# Patient Record
Sex: Male | Born: 1990 | Hispanic: Yes | Marital: Married | Smoking: Current every day smoker
Health system: Southern US, Community
[De-identification: ages and names within clinical notes are randomized; demographics above are authoritative.]

## PROBLEM LIST (undated history)

## (undated) HISTORY — PX: APPENDECTOMY: SHX54

---

## 2015-07-14 ENCOUNTER — Emergency Department (HOSPITAL_COMMUNITY): Payer: BLUE CROSS/BLUE SHIELD | Admitting: Certified Registered Nurse Anesthetist

## 2015-07-14 ENCOUNTER — Observation Stay (HOSPITAL_COMMUNITY)
Admission: EM | Admit: 2015-07-14 | Discharge: 2015-07-15 | Disposition: A | Discharge status: Home / Self Care | Payer: BLUE CROSS/BLUE SHIELD | Attending: Surgery | Admitting: Surgery

## 2015-07-14 ENCOUNTER — Encounter (HOSPITAL_COMMUNITY)
Admission: EM | Disposition: A | Discharge status: Home / Self Care | Payer: Self-pay | Source: Home / Self Care | Attending: Emergency Medicine

## 2015-07-14 ENCOUNTER — Encounter (HOSPITAL_COMMUNITY): Payer: Self-pay | Admitting: Emergency Medicine

## 2015-07-14 ENCOUNTER — Emergency Department (HOSPITAL_COMMUNITY): Payer: BLUE CROSS/BLUE SHIELD

## 2015-07-14 DIAGNOSIS — K3580 Unspecified acute appendicitis: Principal | ICD-10-CM | POA: Diagnosis present

## 2015-07-14 DIAGNOSIS — K3589 Other acute appendicitis without perforation or gangrene: Secondary | ICD-10-CM

## 2015-07-14 DIAGNOSIS — F17200 Nicotine dependence, unspecified, uncomplicated: Secondary | ICD-10-CM | POA: Insufficient documentation

## 2015-07-14 DIAGNOSIS — R109 Unspecified abdominal pain: Secondary | ICD-10-CM | POA: Diagnosis present

## 2015-07-14 HISTORY — PX: LAPAROSCOPIC APPENDECTOMY: SHX408

## 2015-07-14 LAB — COMPREHENSIVE METABOLIC PANEL
ALT: 23 U/L (ref 17–63)
ANION GAP: 13 (ref 5–15)
AST: 29 U/L (ref 15–41)
Albumin: 4.5 g/dL (ref 3.5–5.0)
Alkaline Phosphatase: 97 U/L (ref 38–126)
BUN: 16 mg/dL (ref 6–20)
CALCIUM: 10 mg/dL (ref 8.9–10.3)
CO2: 23 mmol/L (ref 22–32)
Chloride: 103 mmol/L (ref 101–111)
Creatinine, Ser: 0.96 mg/dL (ref 0.61–1.24)
GLUCOSE: 114 mg/dL — AB (ref 65–99)
POTASSIUM: 3.2 mmol/L — AB (ref 3.5–5.1)
Sodium: 139 mmol/L (ref 135–145)
TOTAL PROTEIN: 7.6 g/dL (ref 6.5–8.1)
Total Bilirubin: 0.4 mg/dL (ref 0.3–1.2)

## 2015-07-14 LAB — I-STAT CHEM 8, ED
BUN: 18 mg/dL (ref 6–20)
CALCIUM ION: 1.16 mmol/L (ref 1.12–1.23)
CREATININE: 0.8 mg/dL (ref 0.61–1.24)
Chloride: 102 mmol/L (ref 101–111)
Glucose, Bld: 105 mg/dL — ABNORMAL HIGH (ref 65–99)
HEMATOCRIT: 48 % (ref 39.0–52.0)
HEMOGLOBIN: 16.3 g/dL (ref 13.0–17.0)
Potassium: 3.1 mmol/L — ABNORMAL LOW (ref 3.5–5.1)
SODIUM: 141 mmol/L (ref 135–145)
TCO2: 23 mmol/L (ref 0–100)

## 2015-07-14 LAB — URINALYSIS, ROUTINE W REFLEX MICROSCOPIC
BILIRUBIN URINE: NEGATIVE
Glucose, UA: NEGATIVE mg/dL
HGB URINE DIPSTICK: NEGATIVE
KETONES UR: 15 mg/dL — AB
Leukocytes, UA: NEGATIVE
NITRITE: NEGATIVE
PH: 7.5 (ref 5.0–8.0)
Protein, ur: NEGATIVE mg/dL
SPECIFIC GRAVITY, URINE: 1.022 (ref 1.005–1.030)

## 2015-07-14 LAB — CBC
HCT: 42.5 % (ref 39.0–52.0)
Hemoglobin: 15 g/dL (ref 13.0–17.0)
MCH: 28.4 pg (ref 26.0–34.0)
MCHC: 35.3 g/dL (ref 30.0–36.0)
MCV: 80.3 fL (ref 78.0–100.0)
Platelets: 197 10*3/uL (ref 150–400)
RBC: 5.29 MIL/uL (ref 4.22–5.81)
RDW: 12.6 % (ref 11.5–15.5)
WBC: 17.1 10*3/uL — ABNORMAL HIGH (ref 4.0–10.5)

## 2015-07-14 LAB — LIPASE, BLOOD: LIPASE: 34 U/L (ref 11–51)

## 2015-07-14 SURGERY — APPENDECTOMY, LAPAROSCOPIC
Anesthesia: General | Site: Abdomen

## 2015-07-14 MED ORDER — ONDANSETRON 4 MG PO TBDP: 4.0000 mg | ORAL_TABLET | Freq: Four times a day (QID) | ORAL | Status: DC | PRN

## 2015-07-14 MED ORDER — SODIUM CHLORIDE 0.9 % IV SOLN
INTRAVENOUS | Status: DC
  Administered 2015-07-14: via INTRAVENOUS

## 2015-07-14 MED ORDER — SUGAMMADEX SODIUM 200 MG/2ML IV SOLN
INTRAVENOUS | Status: DC | PRN
  Administered 2015-07-14: 200 mg via INTRAVENOUS

## 2015-07-14 MED ORDER — LIDOCAINE HCL (CARDIAC) 20 MG/ML IV SOLN
INTRAVENOUS | Status: DC | PRN
  Administered 2015-07-14: 100 mg via INTRAVENOUS

## 2015-07-14 MED ORDER — ROCURONIUM BROMIDE 50 MG/5ML IV SOLN
INTRAVENOUS | Status: AC
  Filled 2015-07-14: qty 1

## 2015-07-14 MED ORDER — LACTATED RINGERS IV SOLN
INTRAVENOUS | Status: DC | PRN
  Administered 2015-07-14: 22:00:00 via INTRAVENOUS

## 2015-07-14 MED ORDER — DEXTROSE 5 % IV SOLN
2.0000 g | Freq: Once | INTRAVENOUS | Status: AC
  Administered 2015-07-14: 2 g via INTRAVENOUS
  Filled 2015-07-14: qty 2

## 2015-07-14 MED ORDER — ONDANSETRON HCL 4 MG/2ML IJ SOLN
4.0000 mg | Freq: Four times a day (QID) | INTRAMUSCULAR | Status: DC | PRN
Start: 2015-07-14 — End: 2015-07-15

## 2015-07-14 MED ORDER — ONDANSETRON HCL 4 MG/2ML IJ SOLN
4.0000 mg | Freq: Once | INTRAMUSCULAR | Status: AC
  Administered 2015-07-14: 4 mg via INTRAVENOUS
  Filled 2015-07-14: qty 2

## 2015-07-14 MED ORDER — OXYCODONE-ACETAMINOPHEN 5-325 MG PO TABS
1.0000 | ORAL_TABLET | ORAL | Status: DC | PRN
  Administered 2015-07-15: 2 via ORAL
  Filled 2015-07-14: qty 2

## 2015-07-14 MED ORDER — HYDROMORPHONE HCL 1 MG/ML IJ SOLN: 0.2500 mg | INTRAMUSCULAR | Status: DC | PRN

## 2015-07-14 MED ORDER — FENTANYL CITRATE (PF) 250 MCG/5ML IJ SOLN
INTRAMUSCULAR | Status: AC
Start: 2015-07-14 — End: 2015-07-14
  Filled 2015-07-14: qty 5

## 2015-07-14 MED ORDER — MIDAZOLAM HCL 2 MG/2ML IJ SOLN
INTRAMUSCULAR | Status: AC
  Filled 2015-07-14: qty 2

## 2015-07-14 MED ORDER — DIPHENHYDRAMINE HCL 25 MG PO CAPS: 25.0000 mg | ORAL_CAPSULE | Freq: Four times a day (QID) | ORAL | Status: DC | PRN

## 2015-07-14 MED ORDER — ROCURONIUM BROMIDE 100 MG/10ML IV SOLN
INTRAVENOUS | Status: DC | PRN
  Administered 2015-07-14: 30 mg via INTRAVENOUS

## 2015-07-14 MED ORDER — FENTANYL CITRATE (PF) 100 MCG/2ML IJ SOLN
INTRAMUSCULAR | Status: DC | PRN
Start: 2015-07-14 — End: 2015-07-14
  Administered 2015-07-14: 50 ug via INTRAVENOUS

## 2015-07-14 MED ORDER — MORPHINE SULFATE (PF) 2 MG/ML IV SOLN: 2.0000 mg | INTRAVENOUS | Status: DC | PRN

## 2015-07-14 MED ORDER — KETOROLAC TROMETHAMINE 30 MG/ML IJ SOLN
INTRAMUSCULAR | Status: AC
  Administered 2015-07-14: 30 mg via INTRAVENOUS
  Filled 2015-07-14: qty 1

## 2015-07-14 MED ORDER — IOHEXOL 300 MG/ML  SOLN
100.0000 mL | Freq: Once | INTRAMUSCULAR | Status: AC | PRN
  Administered 2015-07-14: 100 mL via INTRAVENOUS

## 2015-07-14 MED ORDER — KETOROLAC TROMETHAMINE 30 MG/ML IJ SOLN
30.0000 mg | Freq: Four times a day (QID) | INTRAMUSCULAR | Status: DC
  Administered 2015-07-14 – 2015-07-15 (×2): 30 mg via INTRAVENOUS
  Filled 2015-07-14: qty 1

## 2015-07-14 MED ORDER — PHENYLEPHRINE 40 MCG/ML (10ML) SYRINGE FOR IV PUSH (FOR BLOOD PRESSURE SUPPORT)
PREFILLED_SYRINGE | INTRAVENOUS | Status: AC
  Filled 2015-07-14: qty 10

## 2015-07-14 MED ORDER — ONDANSETRON HCL 4 MG/2ML IJ SOLN
INTRAMUSCULAR | Status: AC
  Filled 2015-07-14: qty 2

## 2015-07-14 MED ORDER — SUCCINYLCHOLINE CHLORIDE 20 MG/ML IJ SOLN
INTRAMUSCULAR | Status: DC | PRN
  Administered 2015-07-14: 100 mg via INTRAVENOUS

## 2015-07-14 MED ORDER — IOHEXOL 300 MG/ML  SOLN: 25.0000 mL | Freq: Once | INTRAMUSCULAR | Status: DC | PRN

## 2015-07-14 MED ORDER — ONDANSETRON HCL 4 MG/2ML IJ SOLN
INTRAMUSCULAR | Status: DC | PRN
  Administered 2015-07-14: 4 mg via INTRAVENOUS

## 2015-07-14 MED ORDER — BUPIVACAINE-EPINEPHRINE (PF) 0.25% -1:200000 IJ SOLN
INTRAMUSCULAR | Status: AC
  Filled 2015-07-14: qty 30

## 2015-07-14 MED ORDER — ONDANSETRON HCL 4 MG/2ML IJ SOLN: 4.0000 mg | Freq: Once | INTRAMUSCULAR | Status: DC | PRN

## 2015-07-14 MED ORDER — ENOXAPARIN SODIUM 30 MG/0.3ML ~~LOC~~ SOLN: 30.0000 mg | SUBCUTANEOUS | Status: DC

## 2015-07-14 MED ORDER — PROPOFOL 10 MG/ML IV BOLUS
INTRAVENOUS | Status: AC
  Filled 2015-07-14: qty 20

## 2015-07-14 MED ORDER — EPHEDRINE SULFATE 50 MG/ML IJ SOLN
INTRAMUSCULAR | Status: AC
  Filled 2015-07-14: qty 1

## 2015-07-14 MED ORDER — HYDROMORPHONE HCL 1 MG/ML IJ SOLN
1.0000 mg | Freq: Once | INTRAMUSCULAR | Status: AC
  Administered 2015-07-14: 1 mg via INTRAVENOUS
  Filled 2015-07-14: qty 1

## 2015-07-14 MED ORDER — 0.9 % SODIUM CHLORIDE (POUR BTL) OPTIME
TOPICAL | Status: DC | PRN
  Administered 2015-07-14: 1000 mL

## 2015-07-14 MED ORDER — BUPIVACAINE-EPINEPHRINE 0.25% -1:200000 IJ SOLN
INTRAMUSCULAR | Status: DC | PRN
  Administered 2015-07-14: 30 mL

## 2015-07-14 MED ORDER — DIPHENHYDRAMINE HCL 50 MG/ML IJ SOLN: 25.0000 mg | Freq: Four times a day (QID) | INTRAMUSCULAR | Status: DC | PRN

## 2015-07-14 MED ORDER — SUCCINYLCHOLINE CHLORIDE 20 MG/ML IJ SOLN
INTRAMUSCULAR | Status: AC
  Filled 2015-07-14: qty 1

## 2015-07-14 MED ORDER — LIDOCAINE HCL (CARDIAC) 20 MG/ML IV SOLN
INTRAVENOUS | Status: AC
  Filled 2015-07-14: qty 5

## 2015-07-14 MED ORDER — SODIUM CHLORIDE 0.9 % IR SOLN
Status: DC | PRN
  Administered 2015-07-14: 1000 mL

## 2015-07-14 MED ORDER — SODIUM CHLORIDE 0.9 % IV BOLUS (SEPSIS)
1000.0000 mL | Freq: Once | INTRAVENOUS | Status: AC
  Administered 2015-07-14: 1000 mL via INTRAVENOUS

## 2015-07-14 MED ORDER — SUGAMMADEX SODIUM 200 MG/2ML IV SOLN
INTRAVENOUS | Status: AC
  Filled 2015-07-14: qty 2

## 2015-07-14 MED ORDER — PROPOFOL 10 MG/ML IV BOLUS
INTRAVENOUS | Status: DC | PRN
  Administered 2015-07-14: 130 mg via INTRAVENOUS

## 2015-07-14 MED ORDER — MIDAZOLAM HCL 5 MG/5ML IJ SOLN
INTRAMUSCULAR | Status: DC | PRN
  Administered 2015-07-14: 2 mg via INTRAVENOUS

## 2015-07-14 MED ORDER — MEPERIDINE HCL 25 MG/ML IJ SOLN: 6.2500 mg | INTRAMUSCULAR | Status: DC | PRN

## 2015-07-14 MED ORDER — SODIUM CHLORIDE 0.9 % IJ SOLN
INTRAMUSCULAR | Status: AC
  Filled 2015-07-14: qty 10

## 2015-07-14 SURGICAL SUPPLY — 46 items
APPLIER CLIP ROT 10 11.4 M/L (STAPLE)
BENZOIN TINCTURE PRP APPL 2/3 (GAUZE/BANDAGES/DRESSINGS) ×3 IMPLANT
BLADE SURG ROTATE 9660 (MISCELLANEOUS) IMPLANT
CANISTER SUCTION 2500CC (MISCELLANEOUS) ×3 IMPLANT
CHLORAPREP W/TINT 26ML (MISCELLANEOUS) ×3 IMPLANT
CLIP APPLIE ROT 10 11.4 M/L (STAPLE) IMPLANT
CLOSURE WOUND 1/2 X4 (GAUZE/BANDAGES/DRESSINGS) ×1
COVER SURGICAL LIGHT HANDLE (MISCELLANEOUS) ×3 IMPLANT
CUTTER FLEX LINEAR 45M (STAPLE) ×3 IMPLANT
DRSG TEGADERM 2-3/8X2-3/4 SM (GAUZE/BANDAGES/DRESSINGS) ×3 IMPLANT
DRSG TEGADERM 4X4.75 (GAUZE/BANDAGES/DRESSINGS) ×3 IMPLANT
ELECT REM PT RETURN 9FT ADLT (ELECTROSURGICAL) ×3
ELECTRODE REM PT RTRN 9FT ADLT (ELECTROSURGICAL) ×1 IMPLANT
ENDOLOOP SUT PDS II  0 18 (SUTURE)
ENDOLOOP SUT PDS II 0 18 (SUTURE) IMPLANT
FILTER SMOKE EVAC LAPAROSHD (FILTER) ×3 IMPLANT
GAUZE SPONGE 2X2 8PLY STRL LF (GAUZE/BANDAGES/DRESSINGS) ×1 IMPLANT
GLOVE BIO SURGEON STRL SZ 6.5 (GLOVE) ×2 IMPLANT
GLOVE BIO SURGEON STRL SZ7 (GLOVE) ×3 IMPLANT
GLOVE BIO SURGEONS STRL SZ 6.5 (GLOVE) ×1
GLOVE BIOGEL PI IND STRL 6.5 (GLOVE) ×1 IMPLANT
GLOVE BIOGEL PI IND STRL 7.0 (GLOVE) ×1 IMPLANT
GLOVE BIOGEL PI IND STRL 7.5 (GLOVE) ×1 IMPLANT
GLOVE BIOGEL PI INDICATOR 6.5 (GLOVE) ×2
GLOVE BIOGEL PI INDICATOR 7.0 (GLOVE) ×2
GLOVE BIOGEL PI INDICATOR 7.5 (GLOVE) ×2
GOWN STRL REUS W/ TWL LRG LVL3 (GOWN DISPOSABLE) ×3 IMPLANT
GOWN STRL REUS W/TWL LRG LVL3 (GOWN DISPOSABLE) ×6
KIT BASIN OR (CUSTOM PROCEDURE TRAY) ×3 IMPLANT
KIT ROOM TURNOVER OR (KITS) ×3 IMPLANT
NS IRRIG 1000ML POUR BTL (IV SOLUTION) ×3 IMPLANT
PAD ARMBOARD 7.5X6 YLW CONV (MISCELLANEOUS) ×3 IMPLANT
POUCH SPECIMEN RETRIEVAL 10MM (ENDOMECHANICALS) ×3 IMPLANT
RELOAD STAPLE TA45 3.5 REG BLU (ENDOMECHANICALS) ×3 IMPLANT
SCALPEL HARMONIC ACE (MISCELLANEOUS) ×3 IMPLANT
SET IRRIG TUBING LAPAROSCOPIC (IRRIGATION / IRRIGATOR) ×3 IMPLANT
SPECIMEN JAR SMALL (MISCELLANEOUS) ×3 IMPLANT
SPONGE GAUZE 2X2 STER 10/PKG (GAUZE/BANDAGES/DRESSINGS) ×2
STRIP CLOSURE SKIN 1/2X4 (GAUZE/BANDAGES/DRESSINGS) ×2 IMPLANT
SUT MNCRL AB 4-0 PS2 18 (SUTURE) ×3 IMPLANT
TOWEL OR 17X24 6PK STRL BLUE (TOWEL DISPOSABLE) ×3 IMPLANT
TOWEL OR 17X26 10 PK STRL BLUE (TOWEL DISPOSABLE) ×3 IMPLANT
TRAY LAPAROSCOPIC MC (CUSTOM PROCEDURE TRAY) ×3 IMPLANT
TROCAR XCEL BLADELESS 5X75MML (TROCAR) ×6 IMPLANT
TROCAR XCEL BLUNT TIP 100MML (ENDOMECHANICALS) ×3 IMPLANT
TUBING INSUFFLATION (TUBING) ×3 IMPLANT

## 2015-07-14 NOTE — Op Note (Signed)
Appendectomy, Lap, Procedure Note  Indications: The patient presented with a history of right-sided abdominal pain. A CT scan revealed findings consistent with early acute appendicitis.  Pre-operative Diagnosis: Acute appendicitis without mention of peritonitis  Post-operative Diagnosis: Same  Surgeon: Maayan Jenning K.   Assistants: none  Anesthesia: General endotracheal anesthesia  ASA Class: 1E  Procedure Details  The patient was seen again in the Holding Room. The risks, benefits, complications, treatment options, and expected outcomes were discussed with the patient and/or family. The possibilities of reaction to medication, perforation of viscus, bleeding, recurrent infection, finding a normal appendix, the need for additional procedures, failure to diagnose a condition, and creating a complication requiring transfusion or operation were discussed. There was concurrence with the proposed plan and informed consent was obtained. The site of surgery was properly noted. The patient was taken to Operating Room, identified as Gerald Mccarthy and the procedure verified as Appendectomy. A Time Out was held and the above information confirmed.  The patient was placed in the supine position and general anesthesia was induced.  The abdomen was prepped and draped in a sterile fashion. A one centimeter supraumbilical incision was made.  Dissection was carried down to the fascia bluntly.  The fascia was incised vertically.  We entered the peritoneal cavity bluntly.  A pursestring suture was passed around the incision with a 0 Vicryl.  The Hasson cannula was introduced into the abdomen and the tails of the suture were used to hold the Hasson in place.   The pneumoperitoneum was then established maintaining a maximum pressure of 15 mmHg.  Additional 5 mm cannulas then placed in the left lower quadrant of the abdomen and the right upper quadrant under direct visualization. A careful evaluation of the entire  abdomen was carried out. The patient was placed in Trendelenburg and left lateral decubitus position.  The scope was moved to the right upper quadrant port site. The cecum was mobilized medially.  The appendix was inflamed, but there was no sign of perforation or abscess. The appendix was carefully dissected. The appendix was was skeletonized with the harmonic scalpel.   The appendix was divided at its base using an endo-GIA stapler. Minimal appendiceal stump was left in place. There was no evidence of bleeding, leakage, or complication after division of the appendix. Irrigation was also performed and irrigate suctioned from the abdomen as well.  The umbilical port site was closed with the purse string suture. There was no residual palpable fascial defect.  The trocar site skin wounds were closed with 4-0 Monocryl.  Instrument, sponge, and needle counts were correct at the conclusion of the case.   Findings: The appendix was found to be inflamed. There were not signs of necrosis.  There was not perforation. There was not abscess formation.  Estimated Blood Loss:  Minimal         Drains: none         Specimens: appendix         Complications:  None; patient tolerated the procedure well.         Disposition: PACU - hemodynamically stable.         Condition: stable  Gerald ArmsMatthew K. Corliss Skainssuei, MD, [facility]  General/ Trauma Surgery  07/14/2015 10:43 PM

## 2015-07-14 NOTE — ED Notes (Signed)
Pt states around 1400 today had a sudden onset of RLQ pain. Pt is bent over in sereve pain. Pt denies any n/v/d.

## 2015-07-14 NOTE — Anesthesia Procedure Notes (Signed)
Procedure Name: Intubation Date/Time: 07/14/2015 9:49 PM Performed by: Rogelia BogaMUELLER, Taunia Frasco P Pre-anesthesia Checklist: Patient identified, Emergency Drugs available, Suction available, Patient being monitored and Timeout performed Patient Re-evaluated:Patient Re-evaluated prior to inductionOxygen Delivery Method: Circle system utilized Preoxygenation: Pre-oxygenation with 100% oxygen Intubation Type: IV induction, Cricoid Pressure applied and Rapid sequence Laryngoscope Size: Mac and 4 Grade View: Grade I Tube size: 7.5 mm Number of attempts: 1 Airway Equipment and Method: Stylet Placement Confirmation: ETT inserted through vocal cords under direct vision and positive ETCO2 Secured at: 22 cm Tube secured with: Tape Dental Injury: Teeth and Oropharynx as per pre-operative assessment

## 2015-07-14 NOTE — ED Provider Notes (Signed)
CSN: [id number]     Arrival date & time 07/14/15  1724 History   First MD Initiated Contact with Patient 07/14/15 1739     Chief Complaint  Patient presents with  . Abdominal Pain     Patient is a 24 y.o. male presenting with abdominal pain. The history is provided by the patient. No language interpreter was used.  Abdominal Pain  Gerald Mccarthy is a 24 y.o. male who presents to the Emergency Department complaining of abdominal pain.   Symptoms started suddenly about 1 PM today. Reports severe right lower quadrant abdominal pain that radiates to the entire abdomen. Gerald Mccarthy has associated nausea. No fevers, vomiting, diarrhea, dysuria. No prior similar symptoms. Pain is severe, constant, worsening.  History reviewed. No pertinent past medical history. History reviewed. No pertinent past surgical history. No family history on file. Social History  Substance Use Topics  . Smoking status: Current Every Day Smoker  . Smokeless tobacco: None  . Alcohol Use: No    Review of Systems  Gastrointestinal: Positive for abdominal pain.  All other systems reviewed and are negative.     Allergies  Review of patient's allergies indicates no known allergies.  Home Medications   Prior to Admission medications   Not on File   BP 141/85 mmHg  Pulse 76  Temp(Src) 97.9 F (36.6 C) (Oral)  Resp 16  Ht 5\' 6"  (1.676 m)  Wt 120 lb (54.432 kg)  BMI 19.38 kg/m2 Physical Exam  Constitutional: Gerald Mccarthy is oriented to person, place, and time. Gerald Mccarthy appears well-developed and well-nourished.  HENT:  Head: Normocephalic and atraumatic.  Cardiovascular: Normal rate and regular rhythm.   No murmur heard. Pulmonary/Chest: Effort normal and breath sounds normal. No respiratory distress.  Abdominal: Soft.   Moderate diffuse abdominal tenderness, greatest over the right lower quadrant. Localized voluntary guarding , no rebound  Musculoskeletal: Gerald Mccarthy exhibits no edema or tenderness.  Neurological: Gerald Mccarthy is alert and  oriented to person, place, and time.  Skin: Skin is warm and dry.  Psychiatric: Gerald Mccarthy has a normal mood and affect. His behavior is normal.  Nursing note and vitals reviewed.   ED Course  Procedures (including critical care time) Labs Review Labs Reviewed  I-STAT CHEM 8, ED - Abnormal; Notable for the following:    Potassium 3.1 (*)    Glucose, Bld 105 (*)    All other components within normal limits  LIPASE, BLOOD  COMPREHENSIVE METABOLIC PANEL  CBC  URINALYSIS, ROUTINE W REFLEX MICROSCOPIC (NOT AT [facility])    Imaging Review No results found. I have personally reviewed and evaluated these images and lab results as part of my medical decision-making.   EKG Interpretation None      MDM   Final diagnoses:  Other acute appendicitis    Pt here for evaluation of abdominal pain.  Pt with diffuse tenderness that localizes to RLQ on initial exam.  On repeat eval after meds his pain is significantly improved but Gerald Mccarthy does have ongoing RLQ tenderness.  CT scan c/w acute appendicitis.  Surgery consulted for admission and management.      Tilden FossaElizabeth Jani Moronta, MD 07/15/15 [phone]

## 2015-07-14 NOTE — H&P (Signed)
Gerald Mccarthy is an 24 y.o. male.   Chief Complaint: RLQ abdominal pain HPI: 24 yo Hispanic male in good health presents with 12 hour history of acute RLQ abdominal pain.  The pain radiates across his lower abdomen.  No nausea or vomiting.  He last ate at lunchtime.  We called [employer] to assist with the history and explanation of surgery.  History reviewed. No pertinent past medical history.  History reviewed. No pertinent past surgical history.  No family history on file. Social History:  reports that he has been smoking.  He does not have any smokeless tobacco history on file. He reports that he does not drink alcohol. His drug history is not on file.  Allergies: No Known Allergies  No meds        Results for orders placed or performed during the hospital encounter of 07/14/15 (from the past 48 hour(s))  Lipase, blood     Status: None   Collection Time: 07/14/15  5:50 PM  Result Value Ref Range   Lipase 34 11 - 51 U/L  Comprehensive metabolic panel     Status: Abnormal   Collection Time: 07/14/15  5:50 PM  Result Value Ref Range   Sodium 139 135 - 145 mmol/L   Potassium 3.2 (L) 3.5 - 5.1 mmol/L   Chloride 103 101 - 111 mmol/L   CO2 23 22 - 32 mmol/L   Glucose, Bld 114 (H) 65 - 99 mg/dL   BUN 16 6 - 20 mg/dL   Creatinine, Ser 0.96 0.61 - 1.24 mg/dL   Calcium 10.0 8.9 - 10.3 mg/dL   Total Protein 7.6 6.5 - 8.1 g/dL   Albumin 4.5 3.5 - 5.0 g/dL   AST 29 15 - 41 U/L   ALT 23 17 - 63 U/L   Alkaline Phosphatase 97 38 - 126 U/L   Total Bilirubin 0.4 0.3 - 1.2 mg/dL   GFR calc non Af Amer >60 >60 mL/min   GFR calc Af Amer >60 >60 mL/min    Comment: (NOTE) The eGFR has been calculated using the CKD EPI equation. This calculation has not been validated in all clinical situations. eGFR's persistently <60 mL/min signify possible Chronic Kidney Disease.    Anion gap 13 5 - 15  CBC     Status: Abnormal   Collection Time: 07/14/15  5:50 PM  Result Value Ref Range    WBC 17.1 (H) 4.0 - 10.5 K/uL   RBC 5.29 4.22 - 5.81 MIL/uL   Hemoglobin 15.0 13.0 - 17.0 g/dL   HCT 42.5 39.0 - 52.0 %   MCV 80.3 78.0 - 100.0 fL   MCH 28.4 26.0 - 34.0 pg   MCHC 35.3 30.0 - 36.0 g/dL   RDW 12.6 11.5 - 15.5 %   Platelets 197 150 - 400 K/uL  I-stat Chem 8, ED     Status: Abnormal   Collection Time: 07/14/15  6:01 PM  Result Value Ref Range   Sodium 141 135 - 145 mmol/L   Potassium 3.1 (L) 3.5 - 5.1 mmol/L   Chloride 102 101 - 111 mmol/L   BUN 18 6 - 20 mg/dL   Creatinine, Ser 0.80 0.61 - 1.24 mg/dL   Glucose, Bld 105 (H) 65 - 99 mg/dL   Calcium, Ion 1.16 1.12 - 1.23 mmol/L   TCO2 23 0 - 100 mmol/L   Hemoglobin 16.3 13.0 - 17.0 g/dL   HCT 48.0 39.0 - 52.0 %  Urinalysis, Routine w reflex microscopic (not at  [facility])     Status: Abnormal   Collection Time: 07/14/15  6:05 PM  Result Value Ref Range   Color, Urine YELLOW YELLOW   APPearance CLEAR CLEAR   Specific Gravity, Urine 1.022 1.005 - 1.030   pH 7.5 5.0 - 8.0   Glucose, UA NEGATIVE NEGATIVE mg/dL   Hgb urine dipstick NEGATIVE NEGATIVE   Bilirubin Urine NEGATIVE NEGATIVE   Ketones, ur 15 (A) NEGATIVE mg/dL   Protein, ur NEGATIVE NEGATIVE mg/dL   Nitrite NEGATIVE NEGATIVE   Leukocytes, UA NEGATIVE NEGATIVE    Comment: MICROSCOPIC NOT DONE ON URINES WITH NEGATIVE PROTEIN, BLOOD, LEUKOCYTES, NITRITE, OR GLUCOSE <1000 mg/dL.   Ct Abdomen Pelvis W Contrast  07/14/2015  CLINICAL DATA:  Sudden right lower quadrant pain starting today. EXAM: CT ABDOMEN AND PELVIS WITH CONTRAST TECHNIQUE: Multidetector CT imaging of the abdomen and pelvis was performed using the standard protocol following bolus administration of intravenous contrast. CONTRAST:  184m OMNIPAQUE IOHEXOL 300 MG/ML  SOLN COMPARISON:  None. FINDINGS: The liver, spleen, pancreas, gallbladder, adrenal glands and right kidney are normal. There is no left kidney identified in the left renal fossa. The aorta is normal. There is no abdominal lymphadenopathy.  There is no small bowel obstruction. The colon is normal. The appendix demonstrates enhancing wall with diameter of 6.7 mm. There is no inflammatory change around the appendix. Fluid-filled bladder is normal. The lung bases are clear. No acute abnormalities identified in the visualized bones. IMPRESSION: The appendix demonstrates enhancing wall without surrounding inflammation. In the appropriate clinical setting, these may represent findings of very early appendicitis. If clinically indicated, follow-up CT may be helpful. Electronically Signed   By: WAbelardo DieselM.D.   On: 07/14/2015 20:01    Review of Systems  Constitutional: Negative for weight loss.  HENT: Negative for ear discharge, ear pain, hearing loss and tinnitus.   Eyes: Negative for blurred vision, double vision, photophobia and pain.  Respiratory: Negative for cough, sputum production and shortness of breath.   Cardiovascular: Negative for chest pain.  Gastrointestinal: Positive for abdominal pain. Negative for nausea and vomiting.  Genitourinary: Negative for dysuria, urgency, frequency and flank pain.  Musculoskeletal: Negative for myalgias, back pain, joint pain, falls and neck pain.  Neurological: Negative for dizziness, tingling, sensory change, focal weakness, loss of consciousness and headaches.  Endo/Heme/Allergies: Does not bruise/bleed easily.  Psychiatric/Behavioral: Negative for depression, memory loss and substance abuse. The patient is not nervous/anxious.     Blood pressure 115/64, pulse 79, temperature 97.9 F (36.6 C), temperature source Oral, resp. rate 16, height _0  (1.676 m), weight 54.432 kg (120 lb), SpO2 100 %. Physical Exam  WDWN in NAD HEENT:  EOMI, sclera anicteric Neck:  No masses, no thyromegaly Lungs:  CTA bilaterally; normal respiratory effort CV:  Regular rate and rhythm; no murmurs Abd:  +bowel sounds, soft, non-tender, no masses Ext:  Well-perfused; no edema Skin:  Warm, dry; no sign of  jaundice  Assessment/Plan 1.  Early acute appendicitis  Plan laparoscopic appendectomy tonight.  The surgical procedure has been discussed with the patient.  Potential risks, benefits, alternative treatments, and expected outcomes have been explained.  All of the patient's questions at this time have been answered.  The likelihood of reaching the patient's treatment goal is good.  The patient understand the proposed surgical procedure and wishes to proceed.   Madi Bonfiglio K. 07/14/2015, 8:47 PM

## 2015-07-14 NOTE — Anesthesia Preprocedure Evaluation (Addendum)
Anesthesia Evaluation  Patient identified by MRN, date of birth, ID band Patient awake    Reviewed: Allergy & Precautions, NPO status , Patient's Chart, lab work & pertinent test results  Airway Mallampati: I  TM Distance: >3 FB Neck ROM: Full    Dental  (+) Teeth Intact, Dental Advisory Given   Pulmonary Current Smoker,    Pulmonary exam normal        Cardiovascular Normal cardiovascular exam     Neuro/Psych    GI/Hepatic   Endo/Other    Renal/GU      Musculoskeletal   Abdominal   Peds  Hematology   Anesthesia Other Findings   Reproductive/Obstetrics                            Anesthesia Physical Anesthesia Plan  ASA: II and emergent  Anesthesia Plan: General   Post-op Pain Management:    Induction: Intravenous, Rapid sequence and Cricoid pressure planned  Airway Management Planned: Oral ETT  Additional Equipment:   Intra-op Plan:   Post-operative Plan: Extubation in OR  Informed Consent: I have reviewed the patients History and Physical, chart, labs and discussed the procedure including the risks, benefits and alternatives for the proposed anesthesia with the patient or authorized representative who has indicated his/her understanding and acceptance.   Dental advisory given  Plan Discussed with: CRNA, Surgeon and Anesthesiologist  Anesthesia Plan Comments:        Anesthesia Quick Evaluation

## 2015-07-14 NOTE — Anesthesia Postprocedure Evaluation (Signed)
Anesthesia Post Note  Patient: Gerald Mccarthy  Procedure(s) Performed: Procedure(s) (LRB): APPENDECTOMY LAPAROSCOPIC (N/A)  Patient location during evaluation: PACU Anesthesia Type: General Level of consciousness: awake and alert Pain management: pain level controlled Vital Signs Assessment: post-procedure vital signs reviewed and stable Respiratory status: spontaneous breathing, nonlabored ventilation, respiratory function stable and patient connected to nasal cannula oxygen Cardiovascular status: blood pressure returned to baseline and stable Postop Assessment: no signs of nausea or vomiting Anesthetic complications: no    Last Vitals:  Filed Vitals:   07/14/15 2000 07/14/15 2252  BP: 115/64 132/80  Pulse: 79 117  Temp:  36.4 C  Resp:  20    Last Pain:  Filed Vitals:   07/14/15 2257  PainSc: 1                  Shaundrea Carrigg DAVID

## 2015-07-14 NOTE — Transfer of Care (Signed)
Immediate Anesthesia Transfer of Care Note  Patient: Gerald Mccarthy  Procedure(s) Performed: Procedure(s): APPENDECTOMY LAPAROSCOPIC (N/A)  Patient Location: PACU  Anesthesia Type:General  Level of Consciousness: awake, alert , oriented and patient cooperative  Airway & Oxygen Therapy: Patient Spontanous Breathing and Patient connected to nasal cannula oxygen  Post-op Assessment: Report given to RN, Post -op Vital signs reviewed and stable and Patient moving all extremities X 4  Post vital signs: Reviewed and stable  Last Vitals:  Filed Vitals:   07/14/15 1944 07/14/15 2000  BP: 115/63 115/64  Pulse: 81 79  Temp:    Resp:      Complications: No apparent anesthesia complications

## 2015-07-14 NOTE — ED Notes (Signed)
MD at bedside. 

## 2015-07-15 MED ORDER — HYDROCODONE-ACETAMINOPHEN 5-325 MG PO TABS: 1.0000 | ORAL_TABLET | ORAL | Status: DC | PRN

## 2015-07-15 NOTE — Progress Notes (Signed)
Pt discharged home this morning in stable condition. Work note provided by MD prior to discharge. Discharge teaching provided via a Spanish speaking tech and MD who is also fluent in [place]

## 2015-07-15 NOTE — Discharge Summary (Signed)
Physician Discharge Summary [facility]  Patient ID: Gerald Mccarthy MRN: [id number] DOB/AGE: [birth year] 24 y.o.  Admit date: 07/14/2015 Discharge date: 07/15/2015  Admission Diagnoses:  Acute appendicitis  Discharge Diagnoses:  Active Problems:   Acute appendicitis   Discharged Condition: good  Hospital Course: Patient was admitted for observation following laparoscopic surgery.  Post op course was uncomplicated.  Pain was well controlled.  Tolerated diet.  Patient was prepared for discharge home on POD#1.  Consults: None  Treatments: surgery: lap appendectomy  Discharge Exam: Blood pressure 95/49, pulse 68, temperature 98.5 F (36.9 C), temperature source Oral, resp. rate 20, height  (1.676 m), weight 53.2 kg (117 lb 4.6 oz), SpO2 97 %. HEENT - clear Neck - soft Chest - clear bilaterally Cor - RRR Abd - soft without distension; dressings dry and intact  Disposition: Home  Discharge Instructions    Diet - low sodium heart healthy    Complete by:  As directed      Discharge instructions    Complete by:  As directed   [facility]  LAPAROSCOPIC SURGERY:  POST-OP INSTRUCTIONS  Always review your discharge instruction sheet given to you by the facility where your surgery was performed.  A prescription for pain medication may be given to you upon discharge.  Take your pain medication as prescribed.  If narcotic pain medicine is not needed, then you may take acetaminophen (Tylenol) or ibuprofen (Advil) as needed.  Take your usually prescribed medications unless otherwise directed.  If you need a refill on your pain medication, please contact your pharmacy.  They will contact our office to request authorization. Prescriptions will not be filled after 5 P.M. or on weekends.  You should follow a light diet the first few days after arrival home, such as soup and crackers or toast.  Be sure to include plenty of fluids daily.  Most  patients will experience some swelling and bruising in the area of the incisions.  Ice packs will help.  Swelling and bruising can take several days to resolve.   It is common to experience some constipation after surgery.  Increasing fluid intake and taking a stool softener (such as Colace) will usually help or prevent this problem from occurring.  A mild laxative (Milk of Magnesia or Miralax) should be taken according to package instructions if there has been no bowel movement after 48 hours.  You will have steri-strips and a gauze dressing over your incisions.  You may remove the gauze bandage on the second day after surgery, and you may shower at that time.  Leave your steri-strips (small skin tapes) in place directly over the incision.  These strips should remain on the skin for 5-7 days and then be removed.  You may get them wet in the shower and pat them dry.  Any sutures or staples will be removed at the office during your follow-up visit.  ACTIVITIES:  You may resume regular (light) daily activities beginning the next day - such as daily self-care, walking, climbing stairs - gradually increasing activities as tolerated.  You may have sexual intercourse when it is comfortable.  Refrain from any heavy lifting or straining until approved by your doctor.  You may drive when you are no longer taking prescription pain medication, you can comfortably wear a seatbelt, and you can safely maneuver your car and apply brakes.  You should see your doctor in the office for a follow-up appointment approximately 2-3 weeks after your  surgery.  Make sure that you call for this appointment within a day or two after you arrive home to insure a convenient appointment time.  WHEN TO CALL YOUR DOCTOR: Fever over 101.0 Inability to urinate Continued bleeding from incision Increased pain, redness, or drainage from the incision Increasing abdominal pain  The clinic staff is available to answer your questions  during regular business hours.  Please don't hesitate to call and ask to speak to one of the nurses for clinical concerns.  If you have a medical emergency, go to the nearest emergency room or call 911.  A surgeon from [facility] is always on call for the hospital.  Velora Hecklerodd M. Jeramie Scogin, MD, [facility] Office: [phone] Toll Free:  [phone] FAX [phone]  Website: [email or web address]     Increase activity slowly    Complete by:  As directed      Remove dressing in 24 hours    Complete by:  As directed             Medication List    TAKE these medications        HYDROcodone-acetaminophen 5-325 MG tablet  Commonly known as:  NORCO/VICODIN  Take 1-2 tablets by mouth every 4 (four) hours as needed for moderate pain.           Follow-up Information    [facility] Schedule an appointment as soon as possible for a visit in 2 weeks.   Specialty:  General Surgery   Why:  For wound re-check   Contact information:   [address] [place] [id number] [phone]      Velora Hecklerodd M. Yashvi Jasinski, MD, [facility] Office: [phone]   Signed: Velora HecklerGERKIN,Payal Stanforth M 07/15/2015, 8:26 AM

## 2015-07-15 NOTE — Progress Notes (Signed)
2 po percocet administered for c/o pain 

## 2015-07-15 NOTE — Progress Notes (Signed)
Pt is scheduled for discharge home this morning. Needs a doctor's note

## 2015-07-18 ENCOUNTER — Encounter (HOSPITAL_COMMUNITY): Payer: Self-pay | Admitting: Surgery

## 2016-09-07 ENCOUNTER — Emergency Department (HOSPITAL_COMMUNITY): Payer: BLUE CROSS/BLUE SHIELD

## 2016-09-07 ENCOUNTER — Emergency Department (HOSPITAL_COMMUNITY)
Admission: EM | Admit: 2016-09-07 | Discharge: 2016-09-07 | Disposition: A | Discharge status: Home / Self Care | Payer: BLUE CROSS/BLUE SHIELD | Attending: Emergency Medicine | Admitting: Emergency Medicine

## 2016-09-07 ENCOUNTER — Encounter (HOSPITAL_COMMUNITY): Payer: Self-pay

## 2016-09-07 DIAGNOSIS — R51 Headache: Secondary | ICD-10-CM | POA: Diagnosis not present

## 2016-09-07 DIAGNOSIS — R931 Abnormal findings on diagnostic imaging of heart and coronary circulation: Secondary | ICD-10-CM | POA: Diagnosis not present

## 2016-09-07 DIAGNOSIS — Y9389 Activity, other specified: Secondary | ICD-10-CM | POA: Insufficient documentation

## 2016-09-07 DIAGNOSIS — S161XXA Strain of muscle, fascia and tendon at neck level, initial encounter: Secondary | ICD-10-CM | POA: Insufficient documentation

## 2016-09-07 DIAGNOSIS — R935 Abnormal findings on diagnostic imaging of other abdominal regions, including retroperitoneum: Secondary | ICD-10-CM | POA: Insufficient documentation

## 2016-09-07 DIAGNOSIS — F17200 Nicotine dependence, unspecified, uncomplicated: Secondary | ICD-10-CM | POA: Insufficient documentation

## 2016-09-07 DIAGNOSIS — S199XXA Unspecified injury of neck, initial encounter: Secondary | ICD-10-CM | POA: Diagnosis present

## 2016-09-07 DIAGNOSIS — Y999 Unspecified external cause status: Secondary | ICD-10-CM | POA: Insufficient documentation

## 2016-09-07 DIAGNOSIS — Y92410 Unspecified street and highway as the place of occurrence of the external cause: Secondary | ICD-10-CM | POA: Diagnosis not present

## 2016-09-07 DIAGNOSIS — R9389 Abnormal findings on diagnostic imaging of other specified body structures: Secondary | ICD-10-CM

## 2016-09-07 LAB — COMPREHENSIVE METABOLIC PANEL
ALK PHOS: 84 U/L (ref 38–126)
ALT: 22 U/L (ref 17–63)
ANION GAP: 10 (ref 5–15)
AST: 34 U/L (ref 15–41)
Albumin: 4.7 g/dL (ref 3.5–5.0)
BILIRUBIN TOTAL: 0.8 mg/dL (ref 0.3–1.2)
BUN: 14 mg/dL (ref 6–20)
CO2: 24 mmol/L (ref 22–32)
CREATININE: 1.03 mg/dL (ref 0.61–1.24)
Calcium: 9.9 mg/dL (ref 8.9–10.3)
Chloride: 105 mmol/L (ref 101–111)
GFR calc Af Amer: >60 mL/min (ref >60)
Glucose, Bld: 99 mg/dL (ref 65–99)
Potassium: 4.1 mmol/L (ref 3.5–5.1)
Sodium: 139 mmol/L (ref 135–145)
TOTAL PROTEIN: 7.8 g/dL (ref 6.5–8.1)

## 2016-09-07 LAB — CBC WITH DIFFERENTIAL/PLATELET
Basophils Absolute: 0 10*3/uL (ref 0.0–0.1)
Basophils Relative: 1 %
Eosinophils Absolute: 0.4 10*3/uL (ref 0.0–0.7)
Eosinophils Relative: 5 %
HEMATOCRIT: 46 % (ref 39.0–52.0)
HEMOGLOBIN: 16.7 g/dL (ref 13.0–17.0)
LYMPHS PCT: 35 %
Lymphs Abs: 2.8 10*3/uL (ref 0.7–4.0)
MCH: 28.7 pg (ref 26.0–34.0)
MCHC: 36.3 g/dL — AB (ref 30.0–36.0)
MCV: 79.2 fL (ref 78.0–100.0)
MONOS PCT: 9 %
Monocytes Absolute: 0.8 10*3/uL (ref 0.1–1.0)
NEUTROS PCT: 50 %
Neutro Abs: 4.1 10*3/uL (ref 1.7–7.7)
Platelets: 204 10*3/uL (ref 150–400)
RBC: 5.81 MIL/uL (ref 4.22–5.81)
RDW: 12.3 % (ref 11.5–15.5)
WBC: 8.1 10*3/uL (ref 4.0–10.5)

## 2016-09-07 LAB — LIPASE, BLOOD: LIPASE: 31 U/L (ref 11–51)

## 2016-09-07 MED ORDER — HYDROCODONE-ACETAMINOPHEN 5-325 MG PO TABS: 1.0000 | ORAL_TABLET | ORAL | 0 refills | Status: DC | PRN

## 2016-09-07 MED ORDER — TETANUS-DIPHTH-ACELL PERTUSSIS 5-2.5-18.5 LF-MCG/0.5 IM SUSP
0.5000 mL | Freq: Once | INTRAMUSCULAR | Status: AC
  Administered 2016-09-07: 0.5 mL via INTRAMUSCULAR
  Filled 2016-09-07: qty 0.5

## 2016-09-07 MED ORDER — MORPHINE SULFATE (PF) 4 MG/ML IV SOLN
4.0000 mg | Freq: Once | INTRAVENOUS | Status: AC
  Administered 2016-09-07: 4 mg via INTRAVENOUS
  Filled 2016-09-07: qty 1

## 2016-09-07 MED ORDER — CYCLOBENZAPRINE HCL 10 MG PO TABS: 10.0000 mg | ORAL_TABLET | Freq: Three times a day (TID) | ORAL | 0 refills | Status: DC | PRN

## 2016-09-07 MED ORDER — ONDANSETRON HCL 4 MG/2ML IJ SOLN
4.0000 mg | Freq: Once | INTRAMUSCULAR | Status: AC
  Administered 2016-09-07: 4 mg via INTRAVENOUS
  Filled 2016-09-07: qty 2

## 2016-09-07 MED ORDER — HYDROCODONE-ACETAMINOPHEN 5-325 MG PO TABS
2.0000 | ORAL_TABLET | Freq: Once | ORAL | Status: AC
  Administered 2016-09-07: 2 via ORAL
  Filled 2016-09-07: qty 2

## 2016-09-07 MED ORDER — IOPAMIDOL (ISOVUE-300) INJECTION 61%
INTRAVENOUS | Status: AC
  Administered 2016-09-07: 100 mL
  Filled 2016-09-07: qty 100

## 2016-09-07 MED ORDER — SODIUM CHLORIDE 0.9 % IV BOLUS (SEPSIS)
1000.0000 mL | Freq: Once | INTRAVENOUS | Status: AC
  Administered 2016-09-07: 1000 mL via INTRAVENOUS
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
## 2016-09-15 ENCOUNTER — Encounter (HOSPITAL_COMMUNITY): Payer: Self-pay | Admitting: Emergency Medicine

## 2016-09-15 ENCOUNTER — Emergency Department (HOSPITAL_COMMUNITY)
Admission: EM | Admit: 2016-09-15 | Discharge: 2016-09-15 | Disposition: A | Discharge status: Home / Self Care | Payer: BLUE CROSS/BLUE SHIELD | Attending: Emergency Medicine | Admitting: Emergency Medicine

## 2016-09-15 DIAGNOSIS — F0781 Postconcussional syndrome: Secondary | ICD-10-CM | POA: Diagnosis not present

## 2016-09-15 DIAGNOSIS — R51 Headache: Secondary | ICD-10-CM | POA: Diagnosis present

## 2016-09-15 DIAGNOSIS — F17200 Nicotine dependence, unspecified, uncomplicated: Secondary | ICD-10-CM | POA: Diagnosis not present

## 2016-09-15 MED ORDER — SODIUM CHLORIDE 0.9 % IV BOLUS (SEPSIS)
1000.0000 mL | Freq: Once | INTRAVENOUS | Status: AC
  Administered 2016-09-15: 1000 mL via INTRAVENOUS

## 2016-09-15 MED ORDER — NAPROXEN 500 MG PO TABS: 500.0000 mg | ORAL_TABLET | Freq: Two times a day (BID) | ORAL | 0 refills | Status: AC

## 2016-09-15 MED ORDER — DIPHENHYDRAMINE HCL 50 MG/ML IJ SOLN
25.0000 mg | Freq: Once | INTRAMUSCULAR | Status: AC
  Administered 2016-09-15: 25 mg via INTRAVENOUS
  Filled 2016-09-15: qty 1

## 2016-09-15 MED ORDER — METOCLOPRAMIDE HCL 5 MG/ML IJ SOLN
10.0000 mg | Freq: Once | INTRAMUSCULAR | Status: AC
  Administered 2016-09-15: 10 mg via INTRAVENOUS
  Filled 2016-09-15: qty 2

## 2016-09-15 MED ORDER — DEXAMETHASONE SODIUM PHOSPHATE 10 MG/ML IJ SOLN
10.0000 mg | Freq: Once | INTRAMUSCULAR | Status: AC
  Administered 2016-09-15: 10 mg via INTRAVENOUS
  Filled 2016-09-15: qty 1

## 2016-09-15 MED ORDER — KETOROLAC TROMETHAMINE 30 MG/ML IJ SOLN
15.0000 mg | Freq: Once | INTRAMUSCULAR | Status: AC
  Administered 2016-09-15: 15 mg via INTRAVENOUS
  Filled 2016-09-15: qty 1
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
## 2017-05-05 ENCOUNTER — Emergency Department (HOSPITAL_COMMUNITY): Payer: BLUE CROSS/BLUE SHIELD

## 2017-05-05 ENCOUNTER — Encounter (HOSPITAL_COMMUNITY): Payer: Self-pay

## 2017-05-05 ENCOUNTER — Emergency Department (HOSPITAL_COMMUNITY)
Admission: EM | Admit: 2017-05-05 | Discharge: 2017-05-05 | Disposition: A | Discharge status: Home / Self Care | Payer: BLUE CROSS/BLUE SHIELD | Attending: Pediatrics | Admitting: Pediatrics

## 2017-05-05 DIAGNOSIS — S63501A Unspecified sprain of right wrist, initial encounter: Secondary | ICD-10-CM | POA: Diagnosis not present

## 2017-05-05 DIAGNOSIS — M545 Low back pain, unspecified: Secondary | ICD-10-CM

## 2017-05-05 DIAGNOSIS — Y999 Unspecified external cause status: Secondary | ICD-10-CM | POA: Insufficient documentation

## 2017-05-05 DIAGNOSIS — Y92410 Unspecified street and highway as the place of occurrence of the external cause: Secondary | ICD-10-CM | POA: Diagnosis not present

## 2017-05-05 DIAGNOSIS — F17200 Nicotine dependence, unspecified, uncomplicated: Secondary | ICD-10-CM | POA: Insufficient documentation

## 2017-05-05 DIAGNOSIS — Y9389 Activity, other specified: Secondary | ICD-10-CM | POA: Diagnosis not present

## 2017-05-05 DIAGNOSIS — S6991XA Unspecified injury of right wrist, hand and finger(s), initial encounter: Secondary | ICD-10-CM | POA: Diagnosis present

## 2017-05-05 MED ORDER — IBUPROFEN 400 MG PO TABS
800.0000 mg | ORAL_TABLET | Freq: Once | ORAL | Status: AC
  Administered 2017-05-05: 800 mg via ORAL
  Filled 2017-05-05: qty 2

## 2017-05-05 MED ORDER — IBUPROFEN 600 MG PO TABS: 600.0000 mg | ORAL_TABLET | Freq: Four times a day (QID) | ORAL | 0 refills | Status: DC | PRN

## 2017-05-05 NOTE — ED Notes (Signed)
Ortho tech at bedside to apply splint.  

## 2017-05-05 NOTE — ED Notes (Signed)
Pt transported to xray 

## 2017-05-05 NOTE — ED Notes (Signed)
ED Provider at bedside. 
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
## 2017-05-05 NOTE — ED Notes (Signed)
Ice applied to right wrist.
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
## 2017-05-05 NOTE — ED Notes (Signed)
Pt returned from xray

## 2017-05-05 NOTE — Progress Notes (Signed)
Orthopedic Tech Progress Note Patient Details:  Gerald Mccarthy 1991/07/17 [id number]  Ortho Devices Type of Ortho Device: Velcro wrist splint Ortho Device/Splint Location: rue Ortho Device/Splint Interventions: Ordered, Application, Adjustment   Trinna Post 05/05/2017, 10:21 PM
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
## 2019-07-09 ENCOUNTER — Encounter: Payer: Self-pay | Admitting: Cardiovascular Disease

## 2019-07-09 ENCOUNTER — Ambulatory Visit: Payer: BC Managed Care – PPO | Admitting: Cardiovascular Disease

## 2019-07-09 ENCOUNTER — Encounter (INDEPENDENT_AMBULATORY_CARE_PROVIDER_SITE_OTHER): Payer: Self-pay

## 2019-07-09 ENCOUNTER — Other Ambulatory Visit: Payer: Self-pay

## 2019-07-09 DIAGNOSIS — R0789 Other chest pain: Secondary | ICD-10-CM

## 2019-07-09 NOTE — Progress Notes (Signed)
07/09/2019 Gerald Mccarthy   09/12/1990  [id number]  [place], [place] Primary Cardiologist: Lorretta Harp MD Gerald Mccarthy, [place]  HPI:  Gerald Mccarthy is a 28 y.o. thin appearing married Latino male father of 2 children is accompanied by his wife Gerald Mccarthy today.  He was referred by Gerald Moore FNP for atypical chest pain.  He has no cardiac risk factors other than remote tobacco abuse.  He works as a Glass blower/designer.  He is complained of some atypical sharp fairly quick chest pain both during work and at home when he is at rest.  These do not sound anginal.   No outpatient medications have been marked as taking for the 07/09/19 encounter (Office Visit) with Lorretta Harp, MD.     No Known Allergies  Social History   Socioeconomic History  . Marital status: Married    Spouse name: Not on file  . Number of children: Not on file  . Years of education: Not on file  . Highest education level: Not on file  Occupational History  . Not on file  Tobacco Use  . Smoking status: Current Every Day Smoker  . Smokeless tobacco: Never Used  Substance and Sexual Activity  . Alcohol use: No  . Drug use: No  . Sexual activity: Not on file  Other Topics Concern  . Not on file  Social History Narrative  . Not on file   Social Determinants of Health   Financial Resource Strain:   . Difficulty of Paying Living Expenses: Not on file  Food Insecurity:   . Worried About Charity fundraiser in the Last Year: Not on file  . Ran Out of Food in the Last Year: Not on file  Transportation Needs:   . Lack of Transportation (Medical): Not on file  . Lack of Transportation (Non-Medical): Not on file  Physical Activity:   . Days of Exercise per Week: Not on file  . Minutes of Exercise per Session: Not on file  Stress:   . Feeling of Stress : Not on file  Social Connections:   . Frequency of Communication with Friends and Family: Not on file    . Frequency of Social Gatherings with Friends and Family: Not on file  . Attends Religious Services: Not on file  . Active Member of Clubs or Organizations: Not on file  . Attends Archivist Meetings: Not on file  . Marital Status: Not on file  Intimate Partner Violence:   . Fear of Current or Ex-Partner: Not on file  . Emotionally Abused: Not on file  . Physically Abused: Not on file  . Sexually Abused: Not on file     Review of Systems: General: negative for chills, fever, night sweats or weight changes.  Cardiovascular: negative for chest pain, dyspnea on exertion, edema, orthopnea, palpitations, paroxysmal nocturnal dyspnea or shortness of breath Dermatological: negative for rash Respiratory: negative for cough or wheezing Urologic: negative for hematuria Abdominal: negative for nausea, vomiting, diarrhea, bright red blood per rectum, melena, or hematemesis Neurologic: negative for visual changes, syncope, or dizziness All other systems reviewed and are otherwise negative except as noted above.    Blood pressure 110/64, pulse 63, height 5\' 2"  (1.575 m), weight 123 lb 14.4 oz (56.2 kg), SpO2 99 %.  General appearance: cooperative and no distress Neck: no adenopathy, no carotid bruit, no JVD, supple, symmetrical, trachea midline and thyroid not enlarged, symmetric, no tenderness/mass/nodules  Lungs: clear to auscultation bilaterally Heart: regular rate and rhythm, S1, S2 normal, no murmur, click, rub or gallop Extremities: extremities normal, atraumatic, no cyanosis or edema Pulses: 2+ and symmetric Skin: Skin color, texture, turgor normal. No rashes or lesions Neurologic: Alert and oriented X 3, normal strength and tone. Normal symmetric reflexes. Normal coordination and gait  EKG sinus rhythm at 63 with incomplete right bundle branch block.  I personally reviewed this EKG.  ASSESSMENT AND PLAN:   Atypical chest pain Gerald Mccarthy was referred to me by Gerald Holts   FNP for atypical chest pain.  Patient has no risk factors other than discontinue tobacco abuse (6 pack years).  There is no family history of heart disease.  Is never had a heart attack or stroke.  He gets occasional sharp chest pain both at work and at home at rest.  I do not think this is cardiovascular nature from noncardiac chest wall pain versus other noncardiac etiology such as GERD.  I am to get a coronary calcium score to further restratify.      Gerald Gess MD FACP,FACC,FAHA, [facility] 07/09/2019 10:27 AM

## 2019-07-09 NOTE — Patient Instructions (Signed)
Medication Instructions:  Your physician recommends that you continue on your current medications as directed. Please refer to the Current Medication list given to you today.   *If you need a refill on your cardiac medications before your next appointment, please call your pharmacy*  Lab Work: NONE ordered at this time of appointment   If you have labs (blood work) drawn today and your tests are completely normal, you will receive your results only by: Marland Kitchen MyChart Message (if you have MyChart) OR . A paper copy in the mail If you have any lab test that is abnormal or we need to change your treatment, we will call you to review the results.  Testing/Procedures: Your physician has ordered a Coronary Calcium Score test  Please schedule for 2-3 weeks   Follow-Up: At [facility], you and your health needs are our priority.  As part of our continuing mission to provide you with exceptional heart care, we have created designated Provider Care Teams.  These Care Teams include your primary Cardiologist (physician) and Advanced Practice Providers (APPs -  Physician Assistants and Nurse Practitioners) who all work together to provide you with the care you need, when you need it.  Your next appointment:   AS NEEDED    The format for your next appointment:   In Person  Provider:   You may see Quay Burow, MD or one of the following Advanced Practice Providers on your designated Care Team:    Kerin Ransom, PA-C  [place], [place]  Coletta Memos, [place]   Other Instructions

## 2019-07-09 NOTE — Assessment & Plan Note (Signed)
Gerald Mccarthy was referred to me by Tresa Moore  FNP for atypical chest pain.  Patient has no risk factors other than discontinue tobacco abuse (6 pack years).  There is no family history of heart disease.  Is never had a heart attack or stroke.  He gets occasional sharp chest pain both at work and at home at rest.  I do not think this is cardiovascular nature from noncardiac chest wall pain versus other noncardiac etiology such as GERD.  I am to get a coronary calcium score to further restratify.

## 2019-07-30 ENCOUNTER — Inpatient Hospital Stay: Admission: RE | Admit: 2019-07-30 | Payer: BC Managed Care – PPO | Source: Ambulatory Visit

## 2019-08-13 ENCOUNTER — Inpatient Hospital Stay: Admission: RE | Admit: 2019-08-13 | Payer: BC Managed Care – PPO | Source: Ambulatory Visit

## 2020-05-30 ENCOUNTER — Emergency Department (HOSPITAL_COMMUNITY): Payer: BC Managed Care – PPO

## 2020-05-30 ENCOUNTER — Other Ambulatory Visit: Payer: Self-pay

## 2020-05-30 ENCOUNTER — Emergency Department (HOSPITAL_COMMUNITY)
Admission: EM | Admit: 2020-05-30 | Discharge: 2020-05-31 | Disposition: A | Discharge status: Home / Self Care | Payer: BC Managed Care – PPO | Attending: Emergency Medicine | Admitting: Emergency Medicine

## 2020-05-30 DIAGNOSIS — W1849XA Other slipping, tripping and stumbling without falling, initial encounter: Secondary | ICD-10-CM | POA: Diagnosis not present

## 2020-05-30 DIAGNOSIS — Y9269 Other specified industrial and construction area as the place of occurrence of the external cause: Secondary | ICD-10-CM | POA: Diagnosis not present

## 2020-05-30 DIAGNOSIS — Y990 Civilian activity done for income or pay: Secondary | ICD-10-CM | POA: Insufficient documentation

## 2020-05-30 DIAGNOSIS — S99922A Unspecified injury of left foot, initial encounter: Secondary | ICD-10-CM | POA: Diagnosis present

## 2020-05-30 DIAGNOSIS — S93602A Unspecified sprain of left foot, initial encounter: Secondary | ICD-10-CM | POA: Diagnosis not present

## 2020-05-30 DIAGNOSIS — S99929A Unspecified injury of unspecified foot, initial encounter: Secondary | ICD-10-CM

## 2020-05-30 MED ORDER — NAPROXEN 250 MG PO TABS
500.0000 mg | ORAL_TABLET | Freq: Once | ORAL | Status: AC
  Administered 2020-05-31: 500 mg via ORAL
  Filled 2020-05-30: qty 2
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
## 2020-05-31 MED ORDER — NAPROXEN 500 MG PO TABS: 500.0000 mg | ORAL_TABLET | Freq: Two times a day (BID) | ORAL | 0 refills | Status: AC

## 2020-05-31 NOTE — Discharge Instructions (Addendum)
You were seen today for his foot sprain.  Ice and elevate the foot.  Take naproxen as needed for pain.

## 2020-05-31 NOTE — ED Provider Notes (Signed)
[facility] EMERGENCY DEPARTMENT Provider Note   CSN: [id number] Arrival date & time: 05/30/20  2047     History Chief Complaint  Patient presents with  . Foot Injury    Gerald Mccarthy is a 29 y.o. male.  HPI     This is a 29 year old male who presents with left foot pain.  Patient reports that he was at work when he misstepped and turned his left foot and ankle.  He reports 10 out of 10 pain.  Worse with ambulation.  He has not taken anything for his pain.  Denies other injury.  Did not fall, hit his head, or lose consciousness.  Denies numbness or tingling of the foot.  No past medical history on file.  Patient Active Problem List   Diagnosis Date Noted  . Atypical chest pain 07/09/2019  . Acute appendicitis 07/14/2015    Past Surgical History:  Procedure Laterality Date  . APPENDECTOMY    . LAPAROSCOPIC APPENDECTOMY N/A 07/14/2015   Procedure: APPENDECTOMY LAPAROSCOPIC;  Surgeon: Manus Rudd, MD;  Location: [facility] OR;  Service: General;  Laterality: N/A;       No family history on file.  Social History   Tobacco Use  . Smoking status: Current Every Day Smoker  . Smokeless tobacco: Never Used  Substance Use Topics  . Alcohol use: No  . Drug use: No    Home Medications Prior to Admission medications   Not on File    Allergies    Patient has no known allergies.  Review of Systems   Review of Systems  Musculoskeletal:       Left foot pain  Neurological: Negative for weakness and numbness.  All other systems reviewed and are negative.   Physical Exam Updated Vital Signs BP 101/74 (BP Location: Left Arm)   Pulse 90   Temp 98.1 F (36.7 C) (Oral)   Resp 16   SpO2 97%   Physical Exam Vitals and nursing note reviewed.  Constitutional:      Appearance: He is well-developed.  HENT:     Head: Normocephalic and atraumatic.     Mouth/Throat:     Mouth: Mucous membranes are moist.  Eyes:     Pupils: Pupils are equal, round, and  reactive to light.  Cardiovascular:     Rate and Rhythm: Normal rate and regular rhythm.  Pulmonary:     Effort: Pulmonary effort is normal. No respiratory distress.  Abdominal:     Palpations: Abdomen is soft.  Musculoskeletal:     Cervical back: Neck supple.     Comments: Tenderness to palpation lateral aspect of the proximal dorsum of the foot, slight swelling noted, normal range of motion of the ankle, there is 2+ DP pulse, no proximal fibular tenderness  Lymphadenopathy:     Cervical: No cervical adenopathy.  Skin:    General: Skin is warm and dry.  Neurological:     Mental Status: He is alert and oriented to person, place, and time.  Psychiatric:        Mood and Affect: Mood normal.     ED Results / Procedures / Treatments   Labs (all labs ordered are listed, but only abnormal results are displayed) Labs Reviewed - No data to display  EKG None  Radiology DG Foot Complete Left  Result Date: 05/30/2020 CLINICAL DATA:  Left foot pain EXAM: LEFT FOOT - COMPLETE 3+ VIEW COMPARISON:  None. FINDINGS: There is no evidence of fracture or dislocation. There is no  evidence of arthropathy or other focal bone abnormality. Soft tissues are unremarkable. IMPRESSION: Negative. Electronically Signed   By: Helyn Numbers MD   On: 05/30/2020 21:46    Procedures Procedures (including critical care time)  Medications Ordered in ED Medications  naproxen (NAPROSYN) tablet 500 mg (has no administration in time range)    ED Course  I have reviewed the triage vital signs and the nursing notes.  Pertinent labs & imaging results that were available during my care of the patient were reviewed by me and considered in my medical decision making (see chart for details).    MDM Rules/Calculators/A&P                          Patient presents with foot injury.  He is overall nontoxic vital signs are reassuring.  He has some tenderness and slight swelling of the foot.  There are no overlying  skin changes.  He is neurovascular intact.  X-rays obtained and independently reviewed by myself.  There is no obvious fracture or dislocation.  Suspect sprain.  Recommend RICE therapy.  After history, exam, and medical workup I feel the patient has been appropriately medically screened and is safe for discharge home. Pertinent diagnoses were discussed with the patient. Patient was given return precautions.  Final Clinical Impression(s) / ED Diagnoses Final diagnoses:  Foot injury  Sprain of left foot, initial encounter    Rx / DC Orders ED Discharge Orders    None       Glorian Mcdonell, Mayer Masker, MD 05/31/20 [phone]

## 2024-03-29 ENCOUNTER — Emergency Department (HOSPITAL_BASED_OUTPATIENT_CLINIC_OR_DEPARTMENT_OTHER): Admitting: Radiology

## 2024-03-29 ENCOUNTER — Emergency Department (HOSPITAL_BASED_OUTPATIENT_CLINIC_OR_DEPARTMENT_OTHER)
Admission: EM | Admit: 2024-03-29 | Discharge: 2024-03-29 | Disposition: A | Discharge status: Home / Self Care | Attending: Emergency Medicine | Admitting: Emergency Medicine

## 2024-03-29 DIAGNOSIS — S93402A Sprain of unspecified ligament of left ankle, initial encounter: Secondary | ICD-10-CM | POA: Diagnosis not present

## 2024-03-29 DIAGNOSIS — W11XXXA Fall on and from ladder, initial encounter: Secondary | ICD-10-CM | POA: Insufficient documentation

## 2024-03-29 DIAGNOSIS — M25572 Pain in left ankle and joints of left foot: Secondary | ICD-10-CM | POA: Diagnosis present

## 2024-03-29 MED ORDER — IBUPROFEN 400 MG PO TABS
600.0000 mg | ORAL_TABLET | Freq: Once | ORAL | Status: AC
  Administered 2024-03-29: 600 mg via ORAL
  Filled 2024-03-29: qty 1

## 2024-03-29 NOTE — ED Provider Notes (Signed)
 [place] EMERGENCY DEPARTMENT AT [facility] Provider Note   CSN: [id number] Arrival date & time: 03/29/24  1311     Patient presents with: Ankle Pain   [facility] Gerald Mccarthy is a 33 y.o. male with no significant past medical history presents with concern for left ankle pain that started 3 days ago.  He reports this pain started initially when he stepped off for a ladder, and then started to have gradual onset of pain in his left ankle.  He reports difficulty walking on the left foot.  Denies any numbness in the left foot.    Ankle Pain      Prior to Admission medications   Medication Sig Start Date End Date Taking? Authorizing Provider  naproxen  (NAPROSYN ) 500 MG tablet Take 1 tablet (500 mg total) by mouth 2 (two) times daily. 05/31/20   Horton, Charmaine FALCON, MD    Allergies: Patient has no known allergies.    Review of Systems  Musculoskeletal:        Left ankle pain    Updated Vital Signs BP 117/76 (BP Location: Left Arm)   Pulse 79   Temp 99.1 F (37.3 C) (Oral)   Resp 18   SpO2 100%   Physical Exam Vitals and nursing note reviewed.  Constitutional:      Appearance: Normal appearance.  HENT:     Head: Atraumatic.  Cardiovascular:     Comments: 2+ pedal pulses bilaterally Pulmonary:     Effort: Pulmonary effort is normal.  Musculoskeletal:     Comments: Left lower extremity:  General No erythema, edema, contusions, open wounds   Palpation Tender along the ATFL. Non-tender long the CFL, PTFL, achilles tendon Nontender along the tibia and fibula Nontender along the first through fifth metatarsals and phalanges  ROM Limited ankle flexion, extension, inversion and eversion due to pain  Special tests Negative Thompson's (squeeze) test  Sensation: Sensation intact throughout the lower extremity   Neurological:     General: No focal deficit present.     Mental Status: He is alert.  Psychiatric:        Mood and Affect: Mood normal.         Behavior: Behavior normal.     (all labs ordered are listed, but only abnormal results are displayed) Labs Reviewed - No data to display  EKG: None  Radiology: DG Foot Complete Left Result Date: 03/29/2024 CLINICAL DATA:  pain. EXAM: LEFT ANKLE COMPLETE - 3+ VIEW; LEFT FOOT - COMPLETE 3+ VIEW COMPARISON:  05/30/2020. FINDINGS: No acute fracture or dislocation. No aggressive osseous lesion. Ankle mortise appears intact. No focal soft tissue swelling. No radiopaque foreign bodies. IMPRESSION: *No acute osseous abnormality of the left ankle or foot. Electronically Signed   By: Ree Molt M.D.   On: 03/29/2024 15:39   DG Ankle Complete Left Result Date: 03/29/2024 CLINICAL DATA:  pain. EXAM: LEFT ANKLE COMPLETE - 3+ VIEW; LEFT FOOT - COMPLETE 3+ VIEW COMPARISON:  05/30/2020. FINDINGS: No acute fracture or dislocation. No aggressive osseous lesion. Ankle mortise appears intact. No focal soft tissue swelling. No radiopaque foreign bodies. IMPRESSION: *No acute osseous abnormality of the left ankle or foot. Electronically Signed   By: Ree Molt M.D.   On: 03/29/2024 15:39     Procedures   Medications Ordered in the ED  ibuprofen  (ADVIL ) tablet 600 mg (has no administration in time range)  Medical Decision Making Amount and/or Complexity of Data Reviewed Radiology: ordered.     Differential diagnosis includes but is not limited to sprain, fracture, dislocation, tendon rupture, compartment syndrome.   ED Course:  Upon initial evaluation, patient is well appearing, no acute distress.  Reporting pain to the left ankle ongoing for the past couple days.  On exam, he is point tender over the ATFL.  Nontender over the CFL or PTFL.  Nontender over the Achilles tendon.  No point tenderness over the tibia, fibula, 1st through 5th metatarsals or phalanges.  He has intact range of motion of the left ankle, but limited due to pain.  Neurovascularly intact  in the left lower extremity.  Compartments soft. No concern for compartment syndrome.  The x-ray of his left ankle and left foot are negative for any acute fracture or dislocation.  Based on exam and mechanism of injury, this is consistent with ankle sprain.  He reports difficulty walking on the left foot.  He was placed in CAM walking boot to help with ambulation.  Was able to ambulate without difficulty with Cam boot on.  He was given ibuprofen  for pain and swelling.  Stable and appropriate for discharge home.   Imaging Studies ordered: I ordered imaging studies including x-ray left foot, x-ray left ankle I independently visualized the imaging with scope of interpretation limited to determining acute life threatening conditions related to emergency care. Imaging showed no acute abnormalities I agree with the radiologist interpretation   Medications Given: Ibuprofen   Impression: Left ankle sprain  Disposition:  The patient was discharged home with instructions to wear cam boot provided.  Wean out of boot as tolerated.  Tylenol  and ibuprofen  as needed for pain.  Ice and elevate the foot to help with pain and swelling.  Follow-up with orthopedics within the next 1 to 2 weeks if symptoms not improving on their own. Return precautions given.     This chart was dictated using voice recognition software, Dragon. Despite the best efforts of this provider to proofread and correct errors, errors may still occur which can change documentation meaning.       Final diagnoses:  Moderate left ankle sprain, initial encounter    ED Discharge Orders     None          Veta Palma, DEVONNA 03/29/24 1633    Dreama Longs, MD 03/30/24 1249

## 2024-03-29 NOTE — ED Triage Notes (Signed)
 Patient states pain to the bottom of his left foot since Saturday. Denies injury.

## 2024-03-29 NOTE — Discharge Instructions (Addendum)
 Le atendieron hoy por su dolor de tobillo. Parece que tiene un esguince de tobillo, una lesin de los ligamentos del tobillo. Este se curar con [employer].  Su radiografa de hoy no mostr signos de fractura ni luxacin.  Le han colocado una bota ortopdica para Engineer, materials. Puede ir quitndosela gradualmente segn lo tolere. Realice ejercicios suaves de rango de movimiento segn se le indique (como escribir el ABC con el tobillo) para prevenir la rigidez y Chief Operating Officer el tobillo.  Si los sntomas no mejoran por s solos, consulte con el ortopedista que se indica a Soil scientist las prximas 1 o 2 semanas para un mejor manejo de su esguince de tobillo.  Puede tomar hasta 1000 mg de Tylenol  cada 6 horas segn sea necesario para el dolor. No tome ms de 4 g al da. Le administraron su primera dosis hoy.  Puede tomar hasta 600 mg de ibuprofeno cada 6 horas segn sea necesario para el dolor. No exceda los 2,4 g de ibuprofeno al da.  Puede aplicar hielo y elevar el tobillo para Engineer, materials.  Regrese a urgencias si presenta entumecimiento u hormigueo en el pie, empeoramiento del dolor o cualquier otro sntoma nuevo o preocupante.  You were seen here today for your ankle pain.  It appears you have a ankle sprain which is a injury of the ligaments in your ankle.  This will heal with time.  Your x-ray today did not show any signs of a fracture or dislocation  You have been placed into a walking boot to help with the pain.  You may wean out of the boot as tolerated. Please perform gentle range of motion exercises as provided (like writing the ABC's with your ankle) to prevent stiffness in the ankle and to strengthen the ankle.  Please follow-up with the orthopedic provider listed below within the next 1-2 weeks for further management of your ankle sprain if symptoms not improving on their own.   You may take up to 1000mg  of tylenol  every 6 hours as needed for pain.  Do not take more  then 4g per day.  You were given your first dose here today.  You may use up to 600mg  ibuprofen  every 6 hours as needed for pain.  Do not exceed 2.4g of ibuprofen  per day.  You may ice and elevate the ankle to help with pain.  Return to the ER if you have any numbness or tingling in your foot, worsening pain, any other new or concerning symptoms.
